# Patient Record
Sex: Female | Born: 1990 | Race: Asian | Hispanic: No | Marital: Single | Smoking: Former smoker
Health system: Southern US, Community
[De-identification: ages and names within clinical notes are randomized; demographics above are authoritative.]

## PROBLEM LIST (undated history)

## (undated) DIAGNOSIS — E119 Type 2 diabetes mellitus without complications: Secondary | ICD-10-CM

---
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
## 2015-12-09 LAB — LIPID PANEL
Cholesterol: 306 mg/dL — AB (ref 0–200)
HDL: 38 mg/dL (ref 35–70)
LDL Cholesterol: 208 mg/dL
Triglycerides: 302 mg/dL — AB (ref 40–160)

## 2015-12-09 LAB — HEMOGLOBIN A1C: HEMOGLOBIN A1C: 8.2

## 2015-12-09 LAB — TSH: TSH: 2.31 u[IU]/mL (ref <=5.90)

## 2016-02-03 ENCOUNTER — Telehealth: Payer: Self-pay | Admitting: Endocrinology

## 2016-02-03 ENCOUNTER — Encounter: Payer: Self-pay | Admitting: *Deleted

## 2016-02-03 ENCOUNTER — Encounter: Payer: Self-pay | Admitting: Endocrinology

## 2016-02-03 ENCOUNTER — Encounter: Payer: BLUE CROSS/BLUE SHIELD | Attending: Endocrinology | Admitting: Nutrition

## 2016-02-03 ENCOUNTER — Ambulatory Visit (INDEPENDENT_AMBULATORY_CARE_PROVIDER_SITE_OTHER): Payer: BLUE CROSS/BLUE SHIELD | Admitting: Endocrinology

## 2016-02-03 ENCOUNTER — Other Ambulatory Visit: Payer: Self-pay | Admitting: *Deleted

## 2016-02-03 VITALS — BP 122/78 | HR 83 | Temp 98.1°F | Resp 14 | Ht 63.0 in | Wt 191.6 lb

## 2016-02-03 DIAGNOSIS — R7989 Other specified abnormal findings of blood chemistry: Secondary | ICD-10-CM

## 2016-02-03 DIAGNOSIS — E1165 Type 2 diabetes mellitus with hyperglycemia: Secondary | ICD-10-CM | POA: Diagnosis not present

## 2016-02-03 DIAGNOSIS — R945 Abnormal results of liver function studies: Secondary | ICD-10-CM

## 2016-02-03 DIAGNOSIS — E785 Hyperlipidemia, unspecified: Secondary | ICD-10-CM

## 2016-02-03 DIAGNOSIS — E559 Vitamin D deficiency, unspecified: Secondary | ICD-10-CM

## 2016-02-03 DIAGNOSIS — E282 Polycystic ovarian syndrome: Secondary | ICD-10-CM | POA: Diagnosis not present

## 2016-02-03 DIAGNOSIS — IMO0001 Reserved for inherently not codable concepts without codable children: Secondary | ICD-10-CM

## 2016-02-03 DIAGNOSIS — Z794 Long term (current) use of insulin: Secondary | ICD-10-CM | POA: Insufficient documentation

## 2016-02-03 LAB — POCT GLUCOSE (DEVICE FOR HOME USE): GLUCOSE FASTING, POC: 136 mg/dL — AB (ref 70–99)

## 2016-02-03 MED ORDER — METFORMIN HCL ER 500 MG PO TB24: 2000.0000 mg | ORAL_TABLET | Freq: Every day | ORAL | Status: AC

## 2016-02-03 MED ORDER — ONETOUCH DELICA LANCETS 33G MISC: Status: DC

## 2016-02-03 MED ORDER — GLUCOSE BLOOD VI STRP: ORAL_STRIP | Status: DC

## 2016-02-03 NOTE — Patient Instructions (Signed)
Vitamin D3, 5000 units daily  Start taking Metformin 500 mg, 1 tablet with your main meal for 5 days. Occasionally this may initially cause loose stools or nausea . If  tolerating well after 5 days add a second Metformin tablet (500 mg) at the same time.   Continue adding another tablet after 5 days days if no persistent nausea or diarrhea until reaching the maximum tolerated dose or the full dose of 4 tablets once daily.  Check blood sugars on waking up 2  times a week Also check blood sugars about 2 hours after a meal and do this after different meals by rotation  Recommended blood sugar levels on waking up is 90-120 and about 2 hours after meal is 130-160  Please bring your blood sugar monitor to each visit, thank you

## 2016-02-03 NOTE — Telephone Encounter (Signed)
Pt called requesting for a call back from the Dr. She said she just has a question for him

## 2016-02-03 NOTE — Telephone Encounter (Signed)
Appointment made, rx sent

## 2016-02-03 NOTE — Progress Notes (Signed)
This patient has a strong history of diabetes, and says she knows a little about what she needs to do. We discussed ways to help her own body's insulin to work better, with the need for exercise, weight loss, and balanced meals with portion control.  She reported good understanding of this.   She was given a new meter--One touch Verio and shown how to use it.  It was suggested she test 3X/wk--before and 2hr. After eating.   Goals for the readings: ac: less than 110, and 2hr. After eating: less than 170.  She reported good understanding of this and had no final questions.

## 2016-02-03 NOTE — Addendum Note (Signed)
Addended by: Reather LittlerKUMAR, Clemmie Marxen on: 02/03/2016 02:25 PM   Modules accepted: Orders

## 2016-02-03 NOTE — Patient Instructions (Addendum)
1.  Test blood sugars before, and 2hours after one meal, 3X/wk..  Vary the meal each time. Exercise for 30-40 min. 4-5 days/wk. Eat balanced meals of carbohydrate, protein and fat, but not too much of any one. Drink no sweet drinks, fruit juices or cold cereal and milk. Call if questions.

## 2016-02-03 NOTE — Progress Notes (Signed)
Patient ID: Pam Myers, female   DOB: 07/15/1991, 25 y.o.   MRN: [id number]           Reason for Appointment: Consultation for Type 2 Diabetes  Referring physician: Willette ClusterElizabeth Newsome NP  History of Present Illness:          Date of diagnosis of type 2 diabetes mellitus:   11/2015       Background history:   The patient apparently had gained weight prior to her diagnosis She had no symptoms of increased thirst or urination at the time of diagnosis but was complaining of some blurred vision and mild headaches  Recent history:   Her glucose at the time of diagnosis was 267 which was nonfasting The patient is now coming for an initial evaluation and management Since she had the diagnosis made she has changed her diet significantly with eliminating regular soft drinks, juices, fried food, reducing portions of rice and adding more vegetables She has been scheduled to see the dietitian later this month She also has started an exercise program with aerobic activity for 1-1/2 hours after work in the evenings, 5 days a week She does not complain of any blurred vision or increased thirst now  Non-insulin hypoglycemic drugs the patient is taking are: None        Self-care: The diet that the patient has been following is: tries to limit high-fat meals .     Meal times are:  Breakfast is at Lunch: Dinner:   Typical meal intake: Breakfast is usually cereal, lunch usually sandwich or salad, dinner is vegetables  with fish or chicken, rice.  Has snacks with fruit, granola or almond bar               Dietician visit, most recent: Never               Exercise: 5 days a week     Weight history: She has lost nearly 10 pounds since 1/17  Wt Readings from Last 3 Encounters:  02/03/16 191 lb 9.6 oz (86.909 kg)    Glycemic control:   Lab Results  Component Value Date   HGBA1C 8.2 12/09/2015   Lab Results  Component Value Date   LDLCALC 208 12/09/2015   No results found for:  [facility]        Medication List       This list is accurate as of: 02/03/16 12:45 PM.  Always use your most recent med list.               JUNEL FE 1/20 1-20 MG-MCG tablet  Generic drug:  norethindrone-ethinyl estradiol     metFORMIN 500 MG 24 hr tablet  Commonly known as:  GLUCOPHAGE-XR  Take 4 tablets (2,000 mg total) by mouth daily with supper.        Allergies: No Known Allergies  No past medical history on file.  No past surgical history on file.  Family History  Problem Relation Age of Onset  . Diabetes Father   . Diabetes Maternal Grandmother   . Diabetes Paternal Grandmother   . Heart disease Neg Hx     Social History:  reports that she has quit smoking. She has never used smokeless tobacco. Her alcohol and drug histories are not on file.    Review of Systems    Lipid history: She was not aware of her cholesterol being high, this was checked in January    Lab Results  Component Value Date  CHOL 306* 12/09/2015   HDL 38 12/09/2015   LDLCALC 208 12/09/2015   TRIG 302* 12/09/2015             Most recent foot exam: 2/17  Review of Systems  Constitutional: Positive for weight loss.       Recently lost 8-10 pounds with diet and exercise  HENT: Positive for headaches.   Eyes: Negative for blurred vision.  Respiratory: Negative for shortness of breath.   Cardiovascular: Negative for leg swelling.  Gastrointestinal: Negative for diarrhea.  Endocrine: Positive for menstrual changes. Negative for fatigue.       Since menarche her menstrual cycles have been infrequent, usually 2-3 times a year.  Free testosterone done from [facility] lab 11.4, normal <6.4.  Has started  birth control pills from gynecologist   Genitourinary: Negative for nocturia.  Musculoskeletal: Negative for muscle aches.  Skin: Positive for rash.       She had mild pruritic rash on her arms better with OTC cream Has had some acne, no excessive facial hair  Neurological:  Negative for numbness and tingling.  Psychiatric/Behavioral: Negative for insomnia.      LABS:  Office Visit on 02/03/2016  Component Date Value Ref Range Status  . Glucose Fasting, POC 02/03/2016 136* 70 - 99 mg/dL Final  Abstract on 78/46/9629  Component Date Value Ref Range Status  . Triglycerides 12/09/2015 302* 40 - 160 mg/dL Final  . Cholesterol 52/84/1324 306* 0 - 200 mg/dL Final  . HDL 40/08/2724 38  35 - 70 mg/dL Final  . LDL Cholesterol 12/09/2015 208   Final  . Hemoglobin A1C 12/09/2015 8.2   Final  . TSH 12/09/2015 2.31  .41 - 5.90 uIU/mL Final    Physical Examination:  BP 122/78 mmHg  Pulse 83  Temp(Src) 98.1 F (36.7 C)  Resp 14  Ht  (1.6 m)  Wt 191 lb 9.6 oz (86.909 kg)  BMI 33.95 kg/m2  SpO2 96%  GENERAL:         Patient has generalized obesity without cushingoid features .   HEENT:         Eye exam shows normal external appearance.  Fundus exam shows no retinopathy.  Oral exam shows normal mucosa .  NECK:   There is no lymphadenopathy Thyroid is not enlarged and no nodules felt.  Carotids are normal to palpation and no bruit heard LUNGS:         Chest is symmetrical. Lungs are clear to auscultation.Marland Kitchen   HEART:         Heart sounds:  S1 and S2 are normal. No murmur or click heard., no S3 or S4.   ABDOMEN:   There is no distention present. Liver and spleen are not palpable. No other mass or tenderness present.   NEUROLOGICAL:   Ankle jerks are normal bilaterally.    Diabetic Foot Exam - Simple   No data filed             Vibration sense is nearly normal in distal first toes. MUSCULOSKELETAL:  There is no swelling or deformity of the peripheral joints. Spine is normal to inspection.   EXTREMITIES:     There is no edema. No skin lesions present.Marland Kitchen SKIN:       No rash or lesions of concern. mild facial acne present.  Mild acanthosis present on the back of the neck superiorly         ASSESSMENT:  Diabetes type 2, newly diagnosed with BMI 34  She  has significant insulin resistance and evidence of polycystic ovarian disease also Although her A1c at baseline was 8.2 her glucose today is relatively better at 136 fasting She has done very well with starting an exercise program and trying to modify her diet with reduced carbohydrate and caloric intake Currently has not had any diabetes education and discussed basic problems with diabetes and management Because of her insulin resistance syndrome and need for improved control at her age will need pharmacological therapy  HYPERLIPIDEMIA: She has marked increase in LDL of over 200 along with high triglycerides and may need pharmacological treatment for this  PCOS: She has evidence of PCOS with long history of oligomenorrhea along with significantly high free testosterone  LIVER function abnormality : These are markedly abnormal with ALT 180, likely to be hepatic steatosis at the minimum, may also have steatohepatitis but will need to be rechecked  VITAMIN D deficiency: Her last vitamin D level was 5, currently not on treatment  PLAN:     Metformin to be started with 500 mg of extended release metformin and the dose gradually increased until the maximal he tolerated dose or 2000 mg daily.  Discussed benefits of metformin and given her a handout on the medication as well as detailed instructions on how to titrate the dose and possible side effects  Keep appointment with dietitian  To be instructed in glucose monitoring by nurse educator, discussed timing and targets of glucose monitoring  Follow-up in 6 weeks  Recheck lipids on the next visit and consider statin drug if LDL is still significantly high  and liver functions are normal  Start vitamin D3, 5000 units daily, discussed importance of treating vitamin D deficiency  Check liver functions again on the next visit, may need liver ultrasound  Patient Instructions  Vitamin D3, 5000 units daily  Start taking Metformin 500 mg, 1 tablet  with your main meal for 5 days. Occasionally this may initially cause loose stools or nausea . If  tolerating well after 5 days add a second Metformin tablet (500 mg) at the same time.   Continue adding another tablet after 5 days days if no persistent nausea or diarrhea until reaching the maximum tolerated dose or the full dose of 4 tablets once daily.  Check blood sugars on waking up 2  times a week Also check blood sugars about 2 hours after a meal and do this after different meals by rotation  Recommended blood sugar levels on waking up is 90-120 and about 2 hours after meal is 130-160  Please bring your blood sugar monitor to each visit, thank you    Counseling time on subjects discussed above is over 50% of today's 60 minute visit  Christee Mervine 02/03/2016, 12:45 PM   Note: This office note was prepared with Insurance underwriter. Any transcriptional errors that result from this process are unintentional.

## 2016-02-05 ENCOUNTER — Other Ambulatory Visit: Payer: Self-pay | Admitting: *Deleted

## 2016-02-05 MED ORDER — ACCU-CHEK AVIVA PLUS W/DEVICE KIT: PACK | Status: AC

## 2016-02-05 MED ORDER — GLUCOSE BLOOD VI STRP: ORAL_STRIP | Status: AC

## 2016-02-05 MED ORDER — ACCU-CHEK SOFTCLIX LANCETS MISC
Status: AC
Start: 2016-02-05 — End: ?

## 2016-02-08 ENCOUNTER — Telehealth: Payer: Self-pay | Admitting: Endocrinology

## 2016-02-08 NOTE — Telephone Encounter (Signed)
Patient called stating that insurance will not cover test strips and meter  Please advise    Thank you

## 2016-02-08 NOTE — Telephone Encounter (Signed)
Her insurance would not pay for the one touch, they said to send in accu-chek, that rx has been sent.

## 2016-02-17 ENCOUNTER — Ambulatory Visit: Payer: BLUE CROSS/BLUE SHIELD | Admitting: Skilled Nursing Facility1

## 2016-03-14 ENCOUNTER — Other Ambulatory Visit: Payer: BLUE CROSS/BLUE SHIELD

## 2016-03-16 ENCOUNTER — Ambulatory Visit: Payer: BLUE CROSS/BLUE SHIELD | Admitting: Endocrinology

## 2016-05-03 ENCOUNTER — Ambulatory Visit: Payer: BLUE CROSS/BLUE SHIELD | Admitting: Endocrinology

## 2020-03-09 ENCOUNTER — Ambulatory Visit: Payer: BLUE CROSS/BLUE SHIELD | Admitting: Dietician

## 2020-04-16 ENCOUNTER — Ambulatory Visit: Payer: BLUE CROSS/BLUE SHIELD | Admitting: Dietician

## 2021-06-10 ENCOUNTER — Emergency Department (HOSPITAL_COMMUNITY)
Admission: EM | Admit: 2021-06-10 | Discharge: 2021-06-10 | Disposition: A | Discharge status: Home / Self Care | Payer: 59 | Attending: Emergency Medicine | Admitting: Emergency Medicine

## 2021-06-10 ENCOUNTER — Other Ambulatory Visit: Payer: Self-pay

## 2021-06-10 ENCOUNTER — Emergency Department (HOSPITAL_COMMUNITY): Payer: 59

## 2021-06-10 DIAGNOSIS — R0789 Other chest pain: Secondary | ICD-10-CM | POA: Insufficient documentation

## 2021-06-10 DIAGNOSIS — Z5321 Procedure and treatment not carried out due to patient leaving prior to being seen by health care provider: Secondary | ICD-10-CM | POA: Diagnosis not present

## 2021-06-10 DIAGNOSIS — R42 Dizziness and giddiness: Secondary | ICD-10-CM | POA: Insufficient documentation

## 2021-06-10 LAB — BASIC METABOLIC PANEL
Anion gap: 10 (ref 5–15)
BUN: 9 mg/dL (ref 6–20)
CO2: 26 mmol/L (ref 22–32)
Calcium: 9.4 mg/dL (ref 8.9–10.3)
Chloride: 101 mmol/L (ref 98–111)
Creatinine, Ser: 0.74 mg/dL (ref 0.44–1.00)
GFR, Estimated: >60 mL/min (ref >60)
Glucose, Bld: 130 mg/dL — ABNORMAL HIGH (ref 70–99)
Potassium: 3.6 mmol/L (ref 3.5–5.1)
Sodium: 137 mmol/L (ref 135–145)

## 2021-06-10 LAB — CBC
HCT: 44.6 % (ref 36.0–46.0)
Hemoglobin: 15.1 g/dL — ABNORMAL HIGH (ref 12.0–15.0)
MCH: 29.7 pg (ref 26.0–34.0)
MCHC: 33.9 g/dL (ref 30.0–36.0)
MCV: 87.6 fL (ref 80.0–100.0)
Platelets: 246 10*3/uL (ref 150–400)
RBC: 5.09 MIL/uL (ref 3.87–5.11)
RDW: 12 % (ref 11.5–15.5)
WBC: 10 10*3/uL (ref 4.0–10.5)
nRBC: 0 % (ref 0.0–0.2)

## 2021-06-10 LAB — TROPONIN I (HIGH SENSITIVITY)
Troponin I (High Sensitivity): 5 ng/L (ref <18)
Troponin I (High Sensitivity): 3 ng/L (ref <18)

## 2021-06-10 LAB — I-STAT BETA HCG BLOOD, ED (MC, WL, AP ONLY): I-stat hCG, quantitative: <5 m[IU]/mL (ref <5)

## 2021-06-10 NOTE — ED Provider Notes (Signed)
Emergency Medicine Provider Triage Evaluation Note  Pam Myers , a 30 y.o. female  was evaluated in triage.  Pt complains of chest pain is left-sided, does not radiate, not associated with nausea, vomiting, lightheaded or dizziness, denies becoming diaphoretic, has no cardiac history, no history of PEs or DVTs, currently not on hormone therapy.  Chest pain has since resolved has no complaints at this time..  Review of Systems  Positive: Chest pain, anxious Negative: nausea vomiting  Physical Exam  BP (!) 155/98   Pulse 81   Temp 98.9 F (37.2 C) (Oral)   Resp 16   Ht 5\' 3"  (1.6 m)   Wt 86.2 kg   SpO2 99%   BMI 33.66 kg/m  Gen:   Awake, no distress   Resp:  Normal effort  MSK:   Moves extremities without difficulty  Other:    Medical Decision Making  Medically screening exam initiated at 3:05 PM.  Appropriate orders placed.  Pam Myers was informed that the remainder of the evaluation will be completed by another provider, this initial triage assessment does not replace that evaluation, and the importance of remaining in the ED until their evaluation is complete.  Chest pain, lab work and imaging have been ordered, patient will need further work-up.   Pam Gunning, PA-C 06/10/21 1507    [place], 06/12/21, MD 06/10/21 [phone]

## 2021-06-10 NOTE — ED Triage Notes (Signed)
Pt here POV with complaints of chest pain . Had one episode about 2 weeks ago after Covid vaccine. Had one today lasting about 1 min. Tightness. Denies SOB, N,V. Endorses lightheadedness.

## 2021-11-02 ENCOUNTER — Ambulatory Visit
Admission: RE | Admit: 2021-11-02 | Discharge: 2021-11-02 | Disposition: A | Discharge status: Home / Self Care | Payer: 59 | Source: Ambulatory Visit | Attending: Internal Medicine | Admitting: Internal Medicine

## 2021-11-02 ENCOUNTER — Other Ambulatory Visit: Payer: Self-pay

## 2021-11-02 VITALS — BP 134/92 | HR 101 | Temp 97.9°F | Resp 18

## 2021-11-02 DIAGNOSIS — R030 Elevated blood-pressure reading, without diagnosis of hypertension: Secondary | ICD-10-CM

## 2021-11-02 DIAGNOSIS — S90424A Blister (nonthermal), right lesser toe(s), initial encounter: Secondary | ICD-10-CM

## 2021-11-02 DIAGNOSIS — L03031 Cellulitis of right toe: Secondary | ICD-10-CM | POA: Diagnosis not present

## 2021-11-02 HISTORY — DX: Type 2 diabetes mellitus without complications: E11.9

## 2021-11-02 MED ORDER — DOXYCYCLINE HYCLATE 100 MG PO CAPS: 100.0000 mg | ORAL_CAPSULE | Freq: Two times a day (BID) | ORAL | 0 refills | Status: AC

## 2021-11-02 NOTE — ED Triage Notes (Addendum)
Pt states has a blister to her rt pinky toe this morning. States noticed the pain from walking a lot in [place] last week. States has concerns d/t hx of DM.

## 2021-11-02 NOTE — ED Provider Notes (Signed)
[facility]    CSN: [id number] Arrival date & time: 11/02/21  1438      History   Chief Complaint Chief Complaint  Patient presents with   Toe Pain    HPI Pam Myers is a 30 y.o. female.   Patient presents with blister like lesion to right pinky toe that she noticed this morning.  She just noticed that she had some pain and inflammation to the same toe after walking long periods of time in the visit to [place] last week.  Denies any drainage from the lesion.  Denies any fevers.  Patient does have associated pain and swelling to the area as well.  Denies any numbness or tingling.  Patient is concerned that she does have diabetes.  Patient also has elevated blood pressure reading in urgent care today but does not take any blood pressure medication.  Denies headache, chest pain, blurred vision, dizziness, shortness of breath.   Toe Pain   Past Medical History:  Diagnosis Date   Diabetes mellitus without complication [facility])     Patient Active Problem List   Diagnosis Date Noted   Uncontrolled type 2 diabetes mellitus with hyperglycemia, without long-term current use of insulin ([place]) 02/03/2016   Vitamin D deficiency 02/03/2016   Hyperlipidemia 02/03/2016   PCOS (polycystic ovarian syndrome) 02/03/2016    History reviewed. No pertinent surgical history.  OB History   No obstetric history on file.      Home Medications    Prior to Admission medications   Medication Sig Start Date End Date Taking? Authorizing Provider  doxycycline (VIBRAMYCIN) 100 MG capsule Take 1 capsule (100 mg total) by mouth 2 (two) times daily. 11/02/21  Yes Jadis Mika, [place] E, FNP  ACCU-CHEK SOFTCLIX LANCETS lancets Use as instructed to check blood sugar once a day 02/05/16   Elayne Snare, MD  Blood Glucose Monitoring Suppl (ACCU-CHEK AVIVA PLUS) w/Device KIT Use to check blood sugar once a day 02/05/16   Elayne Snare, MD  glucose blood (ACCU-CHEK AVIVA PLUS) test strip Use as instructed  to check blood sugar once a day 02/05/16   Elayne Snare, MD  JUNEL FE 1/20 1-20 MG-MCG tablet  01/25/16   [provider]  metFORMIN (GLUCOPHAGE-XR) 500 MG 24 hr tablet Take 4 tablets (2,000 mg total) by mouth daily with supper. 02/03/16   Elayne Snare, MD    Family History Family History  Problem Relation Age of Onset   Diabetes Father    Diabetes Maternal Grandmother    Diabetes Paternal Grandmother    Heart disease Neg Hx     Social History Social History   Tobacco Use   Smoking status: Former   Smokeless tobacco: Never  Substance Use Topics   Alcohol use: Not Currently   Drug use: Not Currently     Allergies   Patient has no known allergies.   Review of Systems Review of Systems Per HPI  Physical Exam Triage Vital Signs ED Triage Vitals [11/02/21 1512]  Enc Vitals Group     BP (!) 154/108     Pulse Rate (!) 101     Resp 18     Temp 97.9 F (36.6 C)     Temp Source Oral     SpO2      Weight      Height      Head Circumference      Peak Flow      Pain Score 0     Pain Loc  Pain Edu?      Excl. in [place]?    No data found.  Updated Vital Signs BP (!) 154/108 (BP Location: Left Arm)    Pulse (!) 101    Temp 97.9 F (36.6 C) (Oral)    Resp 18   Visual Acuity Right Eye Distance:   Left Eye Distance:   Bilateral Distance:    Right Eye Near:   Left Eye Near:    Bilateral Near:     Physical Exam Constitutional:      General: She is not in acute distress.    Appearance: Normal appearance. She is not toxic-appearing or diaphoretic.  HENT:     Head: Normocephalic and atraumatic.  Eyes:     Extraocular Movements: Extraocular movements intact.     Conjunctiva/sclera: Conjunctivae normal.  Pulmonary:     Effort: Pulmonary effort is normal.  Musculoskeletal:     Right foot: Normal capillary refill. Swelling and tenderness present. Normal pulse.     Left foot: Normal.     Comments: Patient has blisterlike lesion that appears to be pus filled to  medial portion of left pinky toe directly to the left of the nail.  Also has associated mild swelling and erythema.  Neurovascular intact.  Neurological:     General: No focal deficit present.     Mental Status: She is alert and oriented to person, place, and time. Mental status is at baseline.  Psychiatric:        Mood and Affect: Mood normal.        Behavior: Behavior normal.        Thought Content: Thought content normal.        Judgment: Judgment normal.     UC Treatments / Results  Labs (all labs ordered are listed, but only abnormal results are displayed) Labs Reviewed - No data to display  EKG   Radiology No results found.  Procedures Procedures (including critical care time)  Medications Ordered in UC Medications - No data to display  Initial Impression / Assessment and Plan / UC Course  I have reviewed the triage vital signs and the nursing notes.  Pertinent labs & imaging results that were available during my care of the patient were reviewed by me and considered in my medical decision making (see chart for details).     Will prescribe doxycycline antibiotic for possible cellulitis of right pinky toe.  She takes metformin so cephalexin may be risky.  Patient will need to follow-up with podiatry due to patient's history of diabetes.  Provided patient with contact information for podiatry for follow-up.  Patient has elevated blood pressure reading.  Advised patient to monitor blood pressure at home and to follow-up with PCP if it remains elevated.  Neuro exam is normal and no signs of endorgan damage on exam.  No red flags on exam and patient is neurovascularly intact.  Discussed strict return precautions.  Patient verbalized understanding and was agreeable with plan. Final Clinical Impressions(s) / UC Diagnoses   Final diagnoses:  Blister (nonthermal), right lesser toe(s), initial encounter  Cellulitis of right toe  Elevated blood pressure reading     Discharge  Instructions      You have been prescribed an antibiotic to treat possible infection of your toe.  Please follow-up with provided contact information for podiatry for further evaluation and management. Monitor very closely.     ED Prescriptions     Medication Sig Dispense Auth. Provider   doxycycline (VIBRAMYCIN) 100 MG  capsule Take 1 capsule (100 mg total) by mouth 2 (two) times daily. 20 capsule Teodora Medici, [place]      PDMP not reviewed this encounter.   Teodora Medici, [place] 11/02/21 1524

## 2021-11-02 NOTE — Discharge Instructions (Signed)
You have been prescribed an antibiotic to treat possible infection of your toe.  Please follow-up with provided contact information for podiatry for further evaluation and management. Monitor very closely.

## 2021-11-04 ENCOUNTER — Ambulatory Visit: Payer: Self-pay | Admitting: Podiatry

## 2022-11-19 IMAGING — DX DG CHEST 2V
2 series · 2 of 2 positions shown · non-contrast
Comparison: None.

CLINICAL DATA: Chest pain for several days

EXAM:
CHEST - 2 VIEW

[chest pa]
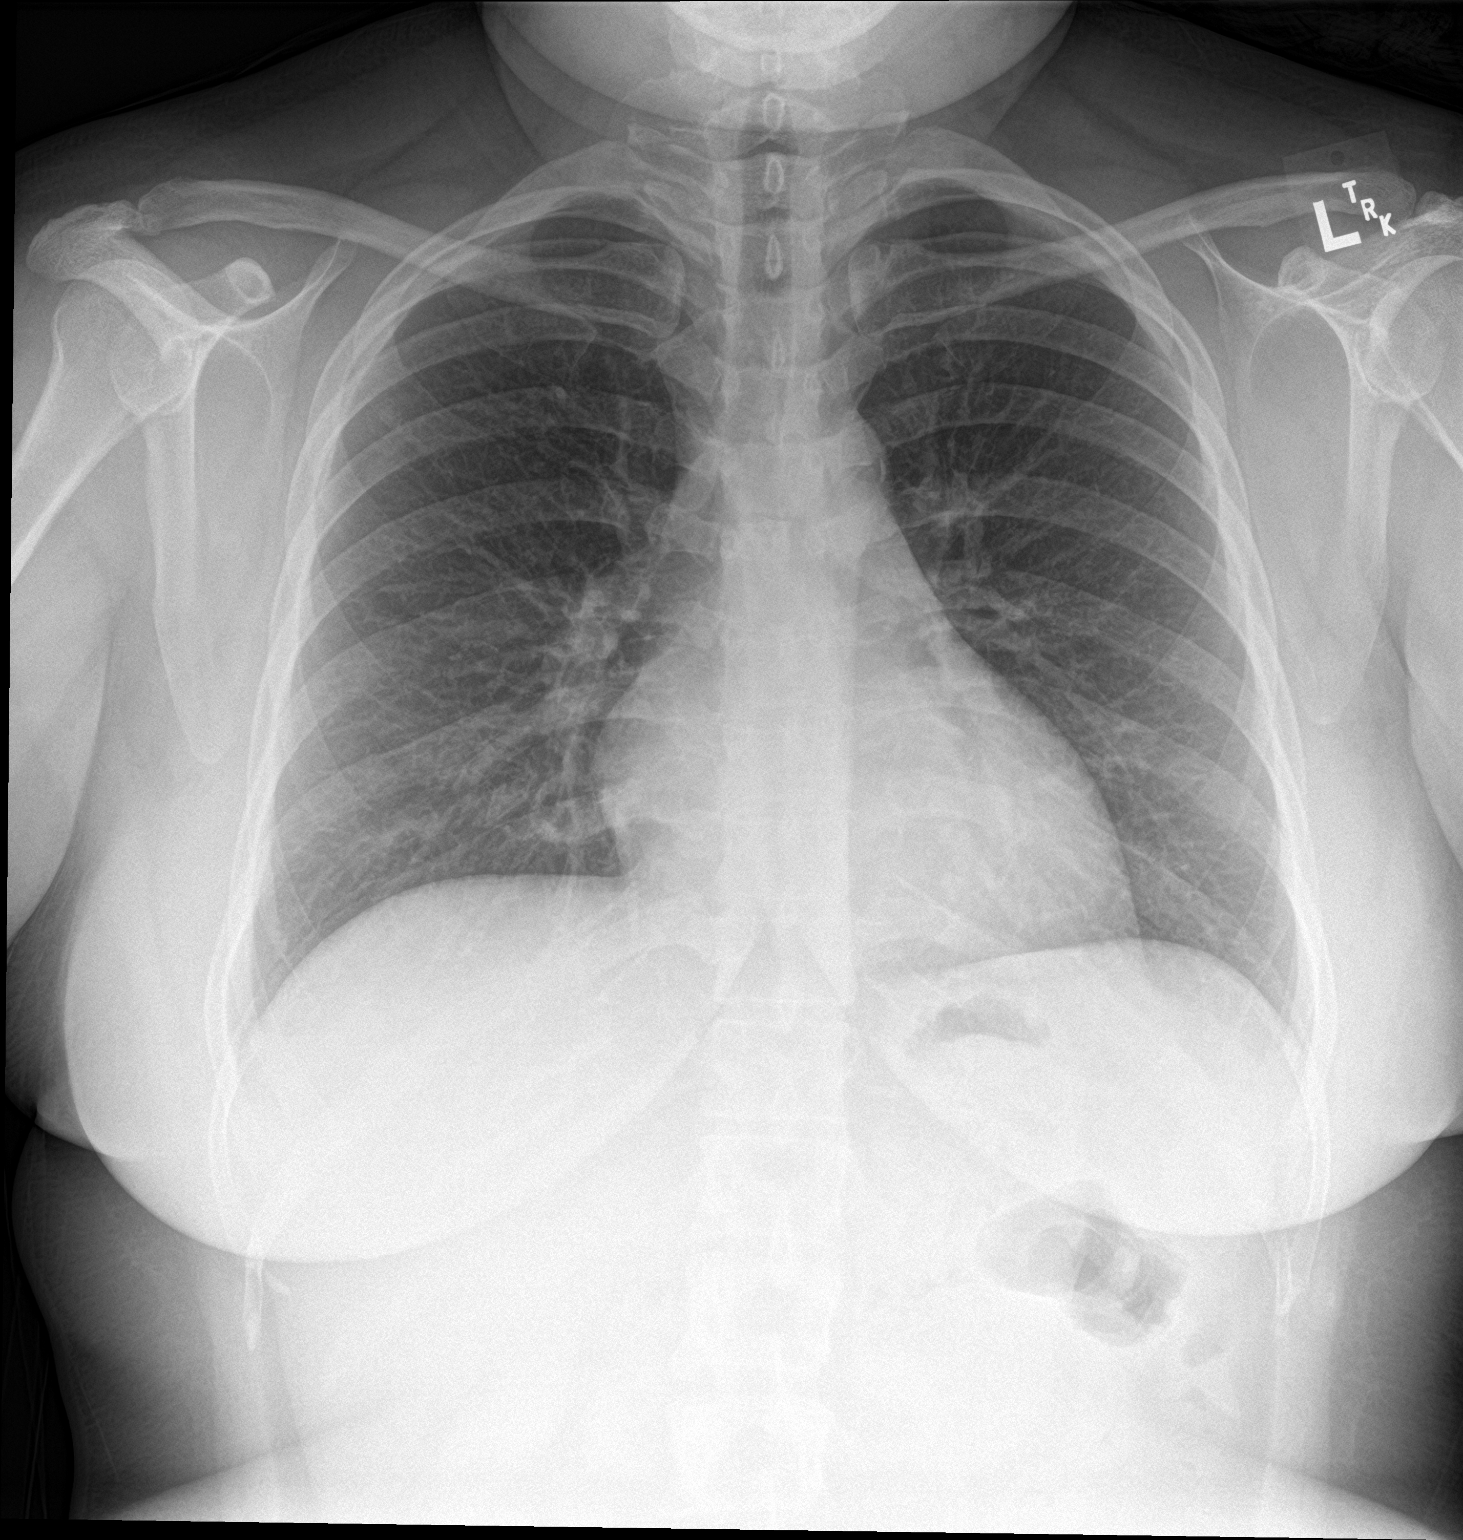

[chest lat]
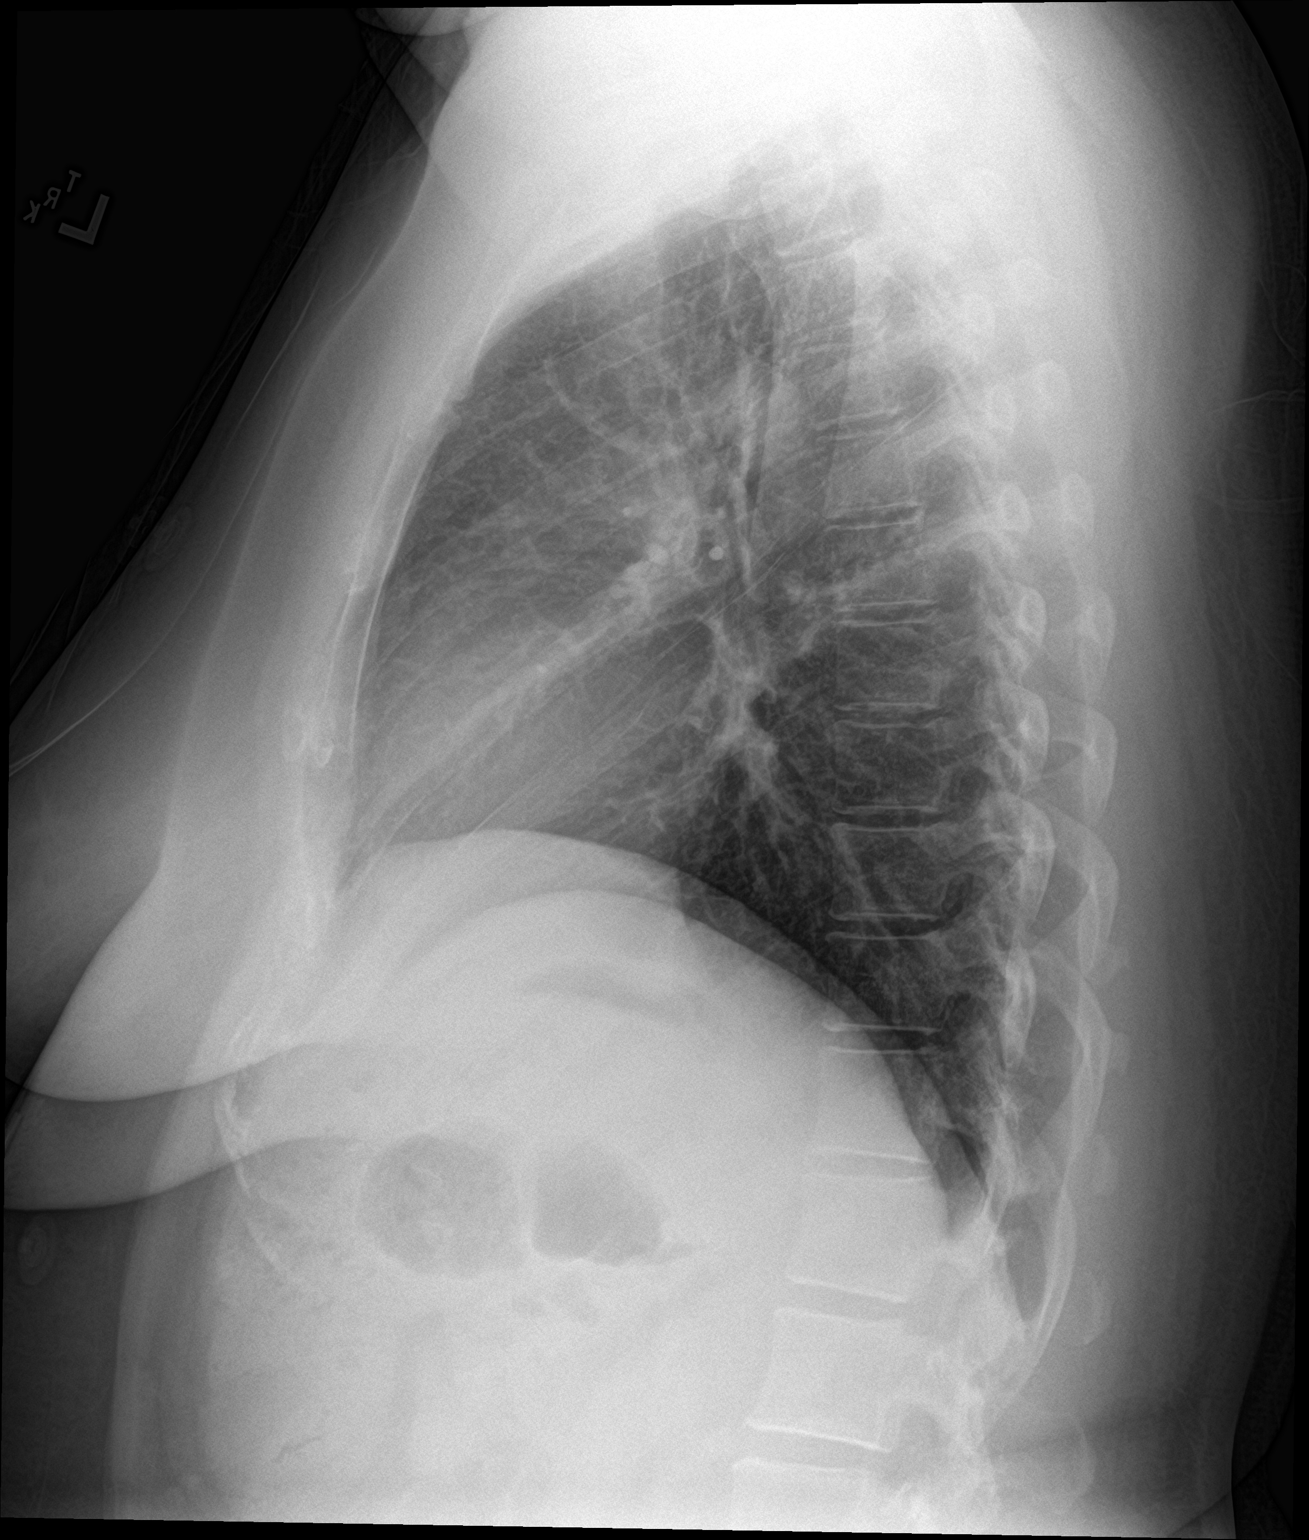

[2 of 2 positions shown; findings below may reference images not displayed]

FINDINGS: Cardiac shadow is within normal limits. The lungs are well aerated
bilaterally. No focal infiltrate or effusion is seen. No bony
abnormality is noted.
IMPRESSION: No active cardiopulmonary disease.

## 2022-12-30 DIAGNOSIS — E7800 Pure hypercholesterolemia, unspecified: Secondary | ICD-10-CM | POA: Diagnosis not present

## 2022-12-30 DIAGNOSIS — N912 Amenorrhea, unspecified: Secondary | ICD-10-CM | POA: Diagnosis not present

## 2022-12-30 DIAGNOSIS — E282 Polycystic ovarian syndrome: Secondary | ICD-10-CM | POA: Diagnosis not present

## 2022-12-30 DIAGNOSIS — N926 Irregular menstruation, unspecified: Secondary | ICD-10-CM | POA: Diagnosis not present

## 2022-12-30 DIAGNOSIS — E119 Type 2 diabetes mellitus without complications: Secondary | ICD-10-CM | POA: Diagnosis not present

## 2023-02-16 DIAGNOSIS — Z13220 Encounter for screening for lipoid disorders: Secondary | ICD-10-CM | POA: Diagnosis not present

## 2023-02-16 DIAGNOSIS — Z131 Encounter for screening for diabetes mellitus: Secondary | ICD-10-CM | POA: Diagnosis not present

## 2023-02-16 DIAGNOSIS — E282 Polycystic ovarian syndrome: Secondary | ICD-10-CM | POA: Diagnosis not present

## 2023-02-16 DIAGNOSIS — Z1329 Encounter for screening for other suspected endocrine disorder: Secondary | ICD-10-CM | POA: Diagnosis not present

## 2023-02-16 DIAGNOSIS — Z01419 Encounter for gynecological examination (general) (routine) without abnormal findings: Secondary | ICD-10-CM | POA: Diagnosis not present

## 2023-02-16 DIAGNOSIS — Z13228 Encounter for screening for other metabolic disorders: Secondary | ICD-10-CM | POA: Diagnosis not present

## 2023-02-16 DIAGNOSIS — Z113 Encounter for screening for infections with a predominantly sexual mode of transmission: Secondary | ICD-10-CM | POA: Diagnosis not present

## 2023-02-16 DIAGNOSIS — Z6834 Body mass index (BMI) 34.0-34.9, adult: Secondary | ICD-10-CM | POA: Diagnosis not present

## 2023-02-16 DIAGNOSIS — N898 Other specified noninflammatory disorders of vagina: Secondary | ICD-10-CM | POA: Diagnosis not present

## 2023-05-09 DIAGNOSIS — E7800 Pure hypercholesterolemia, unspecified: Secondary | ICD-10-CM | POA: Diagnosis not present

## 2023-05-09 DIAGNOSIS — E282 Polycystic ovarian syndrome: Secondary | ICD-10-CM | POA: Diagnosis not present

## 2023-05-09 DIAGNOSIS — E119 Type 2 diabetes mellitus without complications: Secondary | ICD-10-CM | POA: Diagnosis not present

## 2023-05-09 DIAGNOSIS — N926 Irregular menstruation, unspecified: Secondary | ICD-10-CM | POA: Diagnosis not present

## 2023-05-09 DIAGNOSIS — N912 Amenorrhea, unspecified: Secondary | ICD-10-CM | POA: Diagnosis not present

## 2023-08-08 DIAGNOSIS — E119 Type 2 diabetes mellitus without complications: Secondary | ICD-10-CM | POA: Diagnosis not present

## 2023-08-08 DIAGNOSIS — N912 Amenorrhea, unspecified: Secondary | ICD-10-CM | POA: Diagnosis not present

## 2023-08-08 DIAGNOSIS — E7800 Pure hypercholesterolemia, unspecified: Secondary | ICD-10-CM | POA: Diagnosis not present

## 2023-08-08 DIAGNOSIS — E282 Polycystic ovarian syndrome: Secondary | ICD-10-CM | POA: Diagnosis not present

## 2023-09-26 DIAGNOSIS — N926 Irregular menstruation, unspecified: Secondary | ICD-10-CM | POA: Diagnosis not present

## 2023-09-26 DIAGNOSIS — N912 Amenorrhea, unspecified: Secondary | ICD-10-CM | POA: Diagnosis not present

## 2023-09-26 DIAGNOSIS — E119 Type 2 diabetes mellitus without complications: Secondary | ICD-10-CM | POA: Diagnosis not present

## 2023-09-26 DIAGNOSIS — E7800 Pure hypercholesterolemia, unspecified: Secondary | ICD-10-CM | POA: Diagnosis not present

## 2023-09-26 DIAGNOSIS — E282 Polycystic ovarian syndrome: Secondary | ICD-10-CM | POA: Diagnosis not present

## 2023-10-23 DIAGNOSIS — R070 Pain in throat: Secondary | ICD-10-CM | POA: Diagnosis not present

## 2023-10-23 DIAGNOSIS — J029 Acute pharyngitis, unspecified: Secondary | ICD-10-CM | POA: Diagnosis not present

## 2023-10-23 DIAGNOSIS — J039 Acute tonsillitis, unspecified: Secondary | ICD-10-CM | POA: Diagnosis not present
